# Patient Record
Sex: Female | Born: 1990 | Hispanic: No | Marital: Single | Smoking: Never smoker
Health system: Southern US, Community
[De-identification: ages and names within clinical notes are randomized; demographics above are authoritative.]

## PROBLEM LIST (undated history)

## (undated) ENCOUNTER — Emergency Department (HOSPITAL_COMMUNITY): Discharge status: Absent without leave | Payer: Medicare Other

## (undated) DIAGNOSIS — Z789 Other specified health status: Secondary | ICD-10-CM

## (undated) DIAGNOSIS — R4701 Aphasia: Secondary | ICD-10-CM

## (undated) DIAGNOSIS — N920 Excessive and frequent menstruation with regular cycle: Secondary | ICD-10-CM

## (undated) DIAGNOSIS — L853 Xerosis cutis: Secondary | ICD-10-CM

## (undated) DIAGNOSIS — F849 Pervasive developmental disorder, unspecified: Secondary | ICD-10-CM

## (undated) DIAGNOSIS — T8389XA Other specified complication of genitourinary prosthetic devices, implants and grafts, initial encounter: Secondary | ICD-10-CM

## (undated) DIAGNOSIS — R32 Unspecified urinary incontinence: Secondary | ICD-10-CM

## (undated) DIAGNOSIS — F71 Moderate intellectual disabilities: Secondary | ICD-10-CM

## (undated) DIAGNOSIS — R251 Tremor, unspecified: Secondary | ICD-10-CM

## (undated) DIAGNOSIS — J45909 Unspecified asthma, uncomplicated: Secondary | ICD-10-CM

## (undated) DIAGNOSIS — G8194 Hemiplegia, unspecified affecting left nondominant side: Secondary | ICD-10-CM

## (undated) DIAGNOSIS — G808 Other cerebral palsy: Secondary | ICD-10-CM

## (undated) DIAGNOSIS — Z0282 Encounter for adoption services: Secondary | ICD-10-CM

## (undated) DIAGNOSIS — L539 Erythematous condition, unspecified: Secondary | ICD-10-CM

## (undated) DIAGNOSIS — Z87898 Personal history of other specified conditions: Secondary | ICD-10-CM

## (undated) DIAGNOSIS — F88 Other disorders of psychological development: Secondary | ICD-10-CM

## (undated) DIAGNOSIS — Z8673 Personal history of transient ischemic attack (TIA), and cerebral infarction without residual deficits: Secondary | ICD-10-CM

---
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
## 2015-09-26 ENCOUNTER — Encounter (HOSPITAL_COMMUNITY): Payer: Self-pay | Admitting: *Deleted

## 2015-09-26 DIAGNOSIS — R938 Abnormal findings on diagnostic imaging of other specified body structures: Secondary | ICD-10-CM | POA: Insufficient documentation

## 2015-09-26 DIAGNOSIS — R1110 Vomiting, unspecified: Secondary | ICD-10-CM | POA: Insufficient documentation

## 2015-09-26 DIAGNOSIS — J45909 Unspecified asthma, uncomplicated: Secondary | ICD-10-CM | POA: Insufficient documentation

## 2015-09-26 LAB — COMPREHENSIVE METABOLIC PANEL
ALBUMIN: 4.4 g/dL (ref 3.5–5.0)
ALK PHOS: 63 U/L (ref 38–126)
ALT: 14 U/L (ref 14–54)
AST: 25 U/L (ref 15–41)
Anion gap: 6 (ref 5–15)
BILIRUBIN TOTAL: 0.5 mg/dL (ref 0.3–1.2)
BUN: 13 mg/dL (ref 6–20)
CALCIUM: 10.2 mg/dL (ref 8.9–10.3)
CO2: 24 mmol/L (ref 22–32)
Chloride: 106 mmol/L (ref 101–111)
Creatinine, Ser: 0.93 mg/dL (ref 0.44–1.00)
GFR calc Af Amer: >60 mL/min (ref >60)
GFR calc non Af Amer: >60 mL/min (ref >60)
GLUCOSE: 142 mg/dL — AB (ref 65–99)
Potassium: 3.6 mmol/L (ref 3.5–5.1)
Sodium: 136 mmol/L (ref 135–145)
TOTAL PROTEIN: 7.1 g/dL (ref 6.5–8.1)

## 2015-09-26 LAB — CBC
HCT: 38.3 % (ref 36.0–46.0)
Hemoglobin: 12.9 g/dL (ref 12.0–15.0)
MCH: 28.1 pg (ref 26.0–34.0)
MCHC: 33.7 g/dL (ref 30.0–36.0)
MCV: 83.4 fL (ref 78.0–100.0)
Platelets: 300 10*3/uL (ref 150–400)
RBC: 4.59 MIL/uL (ref 3.87–5.11)
RDW: 12.8 % (ref 11.5–15.5)
WBC: 13.6 10*3/uL — ABNORMAL HIGH (ref 4.0–10.5)

## 2015-09-26 NOTE — ED Triage Notes (Addendum)
Pt brought in by health care worker. She states pt started vomiting while eating dinner tonight around 8 pm. Health care worker states pt looks weak to her. Health care worker has not worked with this patient in over a month. Pt has cerebral palsy. Pt is in no apparent distress

## 2015-09-27 ENCOUNTER — Emergency Department (HOSPITAL_COMMUNITY): Payer: Medicare Other

## 2015-09-27 ENCOUNTER — Emergency Department (HOSPITAL_COMMUNITY)
Admission: EM | Admit: 2015-09-27 | Discharge: 2015-09-27 | Disposition: A | Discharge status: Home / Self Care | Payer: Medicare Other | Attending: Emergency Medicine | Admitting: Emergency Medicine

## 2015-09-27 DIAGNOSIS — R1110 Vomiting, unspecified: Secondary | ICD-10-CM | POA: Diagnosis not present

## 2015-09-27 DIAGNOSIS — R1111 Vomiting without nausea: Secondary | ICD-10-CM

## 2015-09-27 LAB — URINE MICROSCOPIC-ADD ON

## 2015-09-27 LAB — URINALYSIS, ROUTINE W REFLEX MICROSCOPIC
Glucose, UA: 100 mg/dL — AB
HGB URINE DIPSTICK: NEGATIVE
Ketones, ur: 15 mg/dL — AB
NITRITE: NEGATIVE
PROTEIN: 30 mg/dL — AB
SPECIFIC GRAVITY, URINE: 1.034 — AB (ref 1.005–1.030)
pH: 5.5 (ref 5.0–8.0)

## 2015-09-27 LAB — LIPASE, BLOOD: Lipase: 31 U/L (ref 11–51)

## 2015-09-27 LAB — POC URINE PREG, ED: PREG TEST UR: NEGATIVE

## 2015-09-27 MED ORDER — SODIUM CHLORIDE 0.9 % IV BOLUS (SEPSIS)
1000.0000 mL | Freq: Once | INTRAVENOUS | Status: AC
  Administered 2015-09-27: 1000 mL via INTRAVENOUS

## 2015-09-27 MED ORDER — IOPAMIDOL (ISOVUE-300) INJECTION 61%
INTRAVENOUS | Status: AC
  Administered 2015-09-27: 100 mL
  Filled 2015-09-27: qty 100

## 2015-09-27 MED ORDER — DIATRIZOATE MEGLUMINE & SODIUM 66-10 % PO SOLN
ORAL | Status: AC
  Filled 2015-09-27: qty 30

## 2015-09-27 NOTE — ED Notes (Signed)
EDP at bedside  

## 2015-09-27 NOTE — ED Notes (Signed)
Pt given second bottle of contrast

## 2015-09-27 NOTE — ED Notes (Signed)
Pt drank 1 bottle of contrast, CT aware and will bring over second bottle in 30 min

## 2015-09-27 NOTE — ED Provider Notes (Signed)
[facility]-EMERGENCY DEPT Provider Note   CSN: [id number] Arrival date & time: 09/26/15  2134  By signing my name below, I, Phillis Haggis, attest that this documentation has been prepared under the direction and in the presence of Tomasita Crumble, MD. Electronically Signed: Phillis Haggis, ED Scribe. 09/27/15. 1:57 AM.  First Provider Contact:  None    History   Chief Complaint Chief Complaint  Patient presents with  . Emesis    The history is provided by a caregiver. No language interpreter was used.  HPI Comments (Level 5 Caveat, pt is nonverbal at baseline): Desiree Cook is a 25 y.o. Female with a hx of cerebral palsy, mental retardation and seizures who presents to the Emergency Department complaining of emesis onset 5 hours ago. Caregiver states that pt began vomiting after one bite of dinner and has not eaten since then. Caregiver also notes that pt looked generally weak. Pt was able to drink apple juice in the waiting room. Parents and caregiver state that pt did not have any preceding symptoms earlier today. They deny fever, chills, sick contacts or diarrhea.   Past Medical History:  Diagnosis Date  . Asthma   . Menorrhagia 02/12/2011  . Mental disorder    CP/mental retardation  . Seizures (HCC)    hx - none since 6 yrs ago - no meds    Patient Active Problem List   Diagnosis Date Noted  . Menorrhagia 02/12/2011    Past Surgical History:  Procedure Laterality Date  . INTRAUTERINE DEVICE INSERTION  02/2006   And Exam under anesthesia  . IUD REMOVAL  02/12/2011   Procedure: INTRAUTERINE DEVICE (IUD) REMOVAL;  Surgeon: Sherron Monday, MD;  Location: [facility];  Service: Gynecology;  Laterality: N/A;  removal of intrauterine device, insertion of intrauterine device     OB History    No data available       Home Medications    Prior to Admission medications   Medication Sig Start Date End Date Taking? Authorizing Provider  albuterol (PROVENTIL) (5 MG/ML) 0.5% nebulizer  solution Take 2.5 mg by nebulization every 6 (six) hours as needed. For asthma attacks     Yes Historical Provider, MD    Family History History reviewed. No pertinent family history.  Social History Social History  Substance Use Topics  . Smoking status: Never Smoker  . Smokeless tobacco: Never Used  . Alcohol use No     Allergies   Review of patient's allergies indicates no known allergies.   Review of Systems Review of Systems  Unable to perform ROS: Patient nonverbal     Physical Exam Updated Vital Signs BP 120/80   Pulse 97   Temp 98.6 F (37 C) (Oral)   Resp 16   SpO2 100%   Physical Exam  Constitutional: She appears well-developed and well-nourished. No distress.  HENT:  Head: Normocephalic and atraumatic.  Nose: Nose normal.  Mouth/Throat: Oropharynx is clear and moist. No oropharyngeal exudate.  Eyes: Conjunctivae and EOM are normal. Pupils are equal, round, and reactive to light. No scleral icterus.  Neck: Normal range of motion. Neck supple. No JVD present. No tracheal deviation present. No thyromegaly present.  Cardiovascular: Normal rate, regular rhythm and normal heart sounds.  Exam reveals no gallop and no friction rub.   No murmur heard. Pulmonary/Chest: Effort normal and breath sounds normal. No respiratory distress. She has no wheezes. She exhibits no tenderness.  Abdominal: Soft. Bowel sounds are normal. She exhibits no distension and no mass.  There is no tenderness. There is no rebound and no guarding.  Musculoskeletal: Normal range of motion. She exhibits no edema or tenderness.  Moves all extremities  Lymphadenopathy:    She has no cervical adenopathy.  Neurological: She is alert.  Skin: Skin is warm and dry. No rash noted. No erythema. No pallor.  Nursing note and vitals reviewed.    ED Treatments / Results  DIAGNOSTIC STUDIES: Oxygen Saturation is 99% on RA, normal by my interpretation.    COORDINATION OF CARE: 1:47 AM-CT scan and  labs  Labs (all labs ordered are listed, but only abnormal results are displayed) Labs Reviewed  COMPREHENSIVE METABOLIC PANEL - Abnormal; Notable for the following:       Result Value   Glucose, Bld 142 (*)    All other components within normal limits  CBC - Abnormal; Notable for the following:    WBC 13.6 (*)    All other components within normal limits  URINALYSIS, ROUTINE W REFLEX MICROSCOPIC (NOT AT [facility]) - Abnormal; Notable for the following:    Color, Urine AMBER (*)    APPearance CLOUDY (*)    Specific Gravity, Urine 1.034 (*)    Glucose, UA 100 (*)    Bilirubin Urine SMALL (*)    Ketones, ur 15 (*)    Protein, ur 30 (*)    Leukocytes, UA SMALL (*)    All other components within normal limits  URINE MICROSCOPIC-ADD ON - Abnormal; Notable for the following:    Squamous Epithelial / LPF 6-30 (*)    Bacteria, UA FEW (*)    Casts HYALINE CASTS (*)    Crystals CA OXALATE CRYSTALS (*)    All other components within normal limits  LIPASE, BLOOD  POC URINE PREG, ED    EKG  EKG Interpretation None       Radiology Ct Abdomen Pelvis W Contrast  Result Date: 09/27/2015 CLINICAL DATA:  Initial evaluation for acute vomiting, weakness. History of cerebral palsy. EXAM: CT ABDOMEN AND PELVIS WITH CONTRAST TECHNIQUE: Multidetector CT imaging of the abdomen and pelvis was performed using the standard protocol following bolus administration of intravenous contrast. CONTRAST:  100mL ISOVUE-300 IOPAMIDOL (ISOVUE-300) INJECTION 61% COMPARISON:  None. FINDINGS: Visualized lung bases are clear. Liver demonstrates a normal contrast enhanced appearance. Gallbladder within normal limits. No biliary dilatation. Spleen, adrenal glands, and pancreas demonstrate no acute abnormality. Kidneys equal in size with symmetric enhancement. No nephrolithiasis, hydronephrosis, or focal enhancing renal mass. Stomach within normal limits. No evidence for bowel obstruction. Appendix visualized in the right  lower quadrant and is of normal caliber and appearance without associated inflammatory changes to suggest acute appendicitis. Moderate to large amount of retained stool throughout the colon. No abnormal wall thickening, mucosal enhancement, or inflammatory fat stranding seen about the bowels. Bladder within normal limits. IUD in appropriate position within the uterus. Ovaries within normal limits. Degenerating corpus luteal cyst noted within the left ovary. No free air or fluid. No adenopathy. Normal intravascular enhancement seen throughout the intra-abdominal aorta and its branch vessels. Minimal hazy soft tissue stranding seen just the left of midline posterior to the sacrum, indeterminate, but could potentially result a small area focal cellulitis (series 201, image 65). No abscess or drainable fluid collection. External soft tissues demonstrate no other acute abnormality. No acute osseous abnormality. No worrisome lytic or blastic osseous lesions. IMPRESSION: 1. Focal hazy soft tissue stranding within the subcutaneous fat just to the left of midline posterior to the sacrum. Finding is indeterminate,  but may reflect a small area of focal cellulitis or early ulceration. No abscess or drainable fluid collection. Correlation with physical exam recommended. 2. No other acute intra-abdominal or pelvic process identified. 3. Large amount of retained stool within the colon, suggesting constipation. 4. IUD in appropriate position within the uterus. Electronically Signed   By: Rise Mu M.D.   On: 09/27/2015 05:20    Procedures Procedures (including critical care time)  Medications Ordered in ED Medications  diatrizoate meglumine-sodium (GASTROGRAFIN) 66-10 % solution (not administered)  sodium chloride 0.9 % bolus 1,000 mL (0 mLs Intravenous Stopped 09/27/15 0359)  iopamidol (ISOVUE-300) 61 % injection (100 mLs  Contrast Given 09/27/15 0439)     Initial Impression / Assessment and Plan / ED Course   I have reviewed the triage vital signs and the nursing notes.  Pertinent labs & imaging results that were available during my care of the patient were reviewed by me and considered in my medical decision making (see chart for details).  Clinical Course    Patient presents to the ED for vomiting.  I can not obtain a good history due to CP. This may be appendicitis, cholecystitis, or other abdominal emergency.  WBC is 13, which may be due to the vomiting but will obtain CT scan for further evaluation.    5:37 AM CT neg for intrabdominal pathology.  Family educated on constipation.  No wounds on patient back as reported on CT.  She appears well and in NAD. VS remain within her normal limits and she is safe for DC.  Final Clinical Impressions(s) / ED Diagnoses   Final diagnoses:  Non-intractable vomiting without nausea, vomiting of unspecified type    I personally performed the services described in this documentation, which was scribed in my presence. The recorded information has been reviewed and is accurate.     New Prescriptions New Prescriptions   No medications on file     Tomasita Crumble, MD 09/27/15 [phone]

## 2016-06-15 DIAGNOSIS — N920 Excessive and frequent menstruation with regular cycle: Secondary | ICD-10-CM

## 2016-06-15 HISTORY — DX: Excessive and frequent menstruation with regular cycle: N92.0

## 2016-07-01 ENCOUNTER — Encounter (HOSPITAL_BASED_OUTPATIENT_CLINIC_OR_DEPARTMENT_OTHER): Payer: Self-pay | Admitting: Obstetrics and Gynecology

## 2016-07-01 DIAGNOSIS — N946 Dysmenorrhea, unspecified: Secondary | ICD-10-CM | POA: Diagnosis present

## 2016-07-06 ENCOUNTER — Encounter (HOSPITAL_BASED_OUTPATIENT_CLINIC_OR_DEPARTMENT_OTHER): Payer: Self-pay | Admitting: *Deleted

## 2016-07-06 NOTE — Progress Notes (Addendum)
SPOKE W/ PT'S MOTHER, PHYLLIS.  PT HAS CEREBRAL PALSY AND NONVERBAL BUT UNDERSTANDS AND FOLLOWS COMMANDS.   NPO AFTER MN.  ARRIVE AT 0600.  QUESTION CBC AND URINE PREG. BECAUSE PER MOTHER WILL BE ABLE TO GET URINE SAMPLE UNLESS IN AND OUT CATH AND POSSIBLY DR BOVARD ONLY WOULD NEED HG.  CALLED AND LM FOR JAMIE, OR SCHEDULR FOR DR Ellyn HackBOVARD ABOUT THIS AND THAT MOTHER WAS TOLD PROCEDURE WOULD BE July 31st .  WILL AWAIT FURTHER INSTRUCTIONS. PT WEARS PULL-UPS.  MENTAL MENTALITY OF 222-24 YEAR OLD.  ADDENDUM:  RECEIVED CALL FROM OFFICE, DR BOVARD STATED OK TO DO HG INSTEAD OF CBC AND URINE PREG. NOT NEEDED , DISCONTINUE ORDER.  ADDENDUM:  TODAY 07-09-2016 ,  REVIEWED CHART W/ DR CARIGNAN MDA VIA PHONE ,  OK TO PROCEED.

## 2016-07-14 ENCOUNTER — Encounter (HOSPITAL_BASED_OUTPATIENT_CLINIC_OR_DEPARTMENT_OTHER): Payer: Self-pay | Admitting: Anesthesiology

## 2016-07-14 NOTE — H&P (Signed)
Desiree Cook is an 26 y.o. female G0 for EUA, remove and replace IUD.  Also pap smear.  Pt is severely mentally handicapped.  IUD to control bleeding.  Pertinent Gynecological History: G0P0  No pap No STD IUD for cycle control  Menstrual History:  No LMP recorded. Patient is not currently having periods (Reason: IUD).    Past Medical History:  Diagnosis Date  . Adopted   . 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 Developmental disorder, pervasive, active state   . Dry skin   . Global developmental delay    acts as 222-24 year old  . Hemiparesis, left (HCC)    residual from x2 stroke's at birth  . History of seizures    epilepsy--  per mother no seizure since approx. 2006 and no medications  . History of stroke    x2 stroke at birth w/ left hemiparesis residual --- mother was drug addict , suspected pt was addicted  . Menorrhagia due to intrauterine device (IUD) (HCC) 06/2016  . Mild asthma   . 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 Nonverbal per adopted mother-- she understands and follow commands   at baseline due to cerebral palsy w/ development delays (developmental age of 42-24 year old)  . Redness of skin    sacral area  . Tremor of both hands    unknown cause-- clinches hands  . Urinary incontinence    wears pull-ups    Past Surgical History:  Procedure Laterality Date  . INTRAUTERINE DEVICE INSERTION  02/2006   And Exam under anesthesia  . IUD REMOVAL  02/12/2011   Procedure: INTRAUTERINE DEVICE (IUD) REMOVAL;  Surgeon: Sherron MondayJody Bovard, MD;  Location: [facility];  Service: Gynecology;  Laterality: N/A;  removal of intrauterine device, insertion of intrauterine device     FH: parents - alcoholism and addiction  Social History:  reports that she has never smoked. She has never used smokeless tobacco.  She reports that she does not drink alcohol or use drugs. single, active at [employer]  Allergies: No Known Allergies  Meds: Mirena, MVI, Mirala    Review of Systems  Unable to perform ROS: Other  Constitutional: Negative.   Skin: Negative.   Pt mentally handicapped  Height 5' (1.524 m), weight 38.6 kg (85 lb). Physical Exam  Constitutional: She is oriented to person, place, and time. She appears well-developed and well-nourished.  HENT:  Head: Normocephalic and atraumatic.  Cardiovascular: Normal rate and regular rhythm.   Respiratory: Breath sounds normal. No respiratory distress. She has no wheezes.  GI: Soft. Bowel sounds are normal. She exhibits no distension. There is no tenderness.  Musculoskeletal: Normal range of motion.  Neurological: She is alert and oriented to person, place, and time.  Skin: Skin is warm and dry.  Psychiatric: She has a normal mood and affect. Her behavior is normal.    Assessment/Plan: 25yo G0P0 with severe mental handicap for EUA, IUD remove and reinsertion D/w guardians r/b/a of IUD replacement, IUD originally placed in 2008, replaced 2012, another replacement Also pap, exam  Desiree Cook 07/14/2016, 8:45 PM

## 2016-07-14 NOTE — Anesthesia Preprocedure Evaluation (Addendum)
Anesthesia Evaluation  Patient identified by MRN, date of birth, ID band Patient awake    Reviewed: Allergy & Precautions, NPO status , Patient's Chart, lab work & pertinent test results  Airway Mallampati: II       Dental  (+) Teeth Intact, Dental Advisory Given   Pulmonary asthma ,    breath sounds clear to auscultation       Cardiovascular negative cardio ROS   Rhythm:Regular Rate:Normal     Neuro/Psych Seizures -, Well Controlled,  PSYCHIATRIC DISORDERS CVA, Residual Symptoms    GI/Hepatic negative GI ROS, Neg liver ROS,   Endo/Other  negative endocrine ROS  Renal/GU negative Renal ROS     Musculoskeletal negative musculoskeletal ROS (+)   Abdominal   Peds  (+) mental retardationCerebral Palsy - Non-verbal per chart   Hematology negative hematology ROS (+)   Anesthesia Other Findings Day of surgery medications reviewed with the patient.  Reproductive/Obstetrics negative OB ROS                            Anesthesia Physical Anesthesia Plan  ASA: III  Anesthesia Plan: MAC   Post-op Pain Management:    Induction: Intravenous  Airway Management Planned: Natural Airway  Additional Equipment:   Intra-op Plan:   Post-operative Plan:   Informed Consent: I have reviewed the patients History and Physical, chart, labs and discussed the procedure including the risks, benefits and alternatives for the proposed anesthesia with the patient or authorized representative who has indicated his/her understanding and acceptance.     Plan Discussed with:   Anesthesia Plan Comments:        Anesthesia Quick Evaluation

## 2016-07-15 ENCOUNTER — Ambulatory Visit (HOSPITAL_BASED_OUTPATIENT_CLINIC_OR_DEPARTMENT_OTHER): Payer: Medicare Other | Admitting: Anesthesiology

## 2016-07-15 ENCOUNTER — Ambulatory Visit (HOSPITAL_BASED_OUTPATIENT_CLINIC_OR_DEPARTMENT_OTHER)
Admission: RE | Admit: 2016-07-15 | Discharge: 2016-07-15 | Disposition: A | Discharge status: Home / Self Care | Payer: Medicare Other | Source: Ambulatory Visit | Attending: Obstetrics and Gynecology | Admitting: Obstetrics and Gynecology

## 2016-07-15 ENCOUNTER — Encounter (HOSPITAL_BASED_OUTPATIENT_CLINIC_OR_DEPARTMENT_OTHER): Payer: Self-pay

## 2016-07-15 ENCOUNTER — Encounter (HOSPITAL_BASED_OUTPATIENT_CLINIC_OR_DEPARTMENT_OTHER)
Admission: RE | Disposition: A | Discharge status: Home / Self Care | Payer: Self-pay | Source: Ambulatory Visit | Attending: Obstetrics and Gynecology

## 2016-07-15 DIAGNOSIS — G40909 Epilepsy, unspecified, not intractable, without status epilepticus: Secondary | ICD-10-CM | POA: Insufficient documentation

## 2016-07-15 DIAGNOSIS — Z79899 Other long term (current) drug therapy: Secondary | ICD-10-CM | POA: Insufficient documentation

## 2016-07-15 DIAGNOSIS — N946 Dysmenorrhea, unspecified: Secondary | ICD-10-CM | POA: Diagnosis present

## 2016-07-15 DIAGNOSIS — Z811 Family history of alcohol abuse and dependence: Secondary | ICD-10-CM | POA: Insufficient documentation

## 2016-07-15 DIAGNOSIS — Z30433 Encounter for removal and reinsertion of intrauterine contraceptive device: Secondary | ICD-10-CM | POA: Diagnosis present

## 2016-07-15 DIAGNOSIS — F72 Severe intellectual disabilities: Secondary | ICD-10-CM | POA: Diagnosis not present

## 2016-07-15 DIAGNOSIS — Z8673 Personal history of transient ischemic attack (TIA), and cerebral infarction without residual deficits: Secondary | ICD-10-CM | POA: Insufficient documentation

## 2016-07-15 DIAGNOSIS — G808 Other cerebral palsy: Secondary | ICD-10-CM | POA: Diagnosis not present

## 2016-07-15 HISTORY — DX: Pervasive developmental disorder, unspecified: F84.9

## 2016-07-15 HISTORY — DX: Aphasia: R47.01

## 2016-07-15 HISTORY — DX: Erythematous condition, unspecified: L53.9

## 2016-07-15 HISTORY — DX: Encounter for adoption services: Z02.82

## 2016-07-15 HISTORY — DX: Other cerebral palsy: G80.8

## 2016-07-15 HISTORY — DX: Personal history of other specified conditions: Z87.898

## 2016-07-15 HISTORY — DX: Tremor, unspecified: R25.1

## 2016-07-15 HISTORY — DX: Unspecified asthma, uncomplicated: J45.909

## 2016-07-15 HISTORY — DX: Personal history of transient ischemic attack (TIA), and cerebral infarction without residual deficits: Z86.73

## 2016-07-15 HISTORY — DX: Unspecified urinary incontinence: R32

## 2016-07-15 HISTORY — PX: IUD REMOVAL: SHX5392

## 2016-07-15 HISTORY — DX: Xerosis cutis: L85.3

## 2016-07-15 HISTORY — DX: Other specified complication of genitourinary prosthetic devices, implants and grafts, initial encounter: T83.89XA

## 2016-07-15 HISTORY — DX: Hemiplegia, unspecified affecting left nondominant side: G81.94

## 2016-07-15 HISTORY — DX: Moderate intellectual disabilities: F71

## 2016-07-15 HISTORY — PX: INTRAUTERINE DEVICE (IUD) INSERTION: SHX5877

## 2016-07-15 HISTORY — DX: Excessive and frequent menstruation with regular cycle: N92.0

## 2016-07-15 HISTORY — DX: Other disorders of psychological development: F88

## 2016-07-15 HISTORY — DX: Other specified health status: Z78.9

## 2016-07-15 LAB — POCT HEMOGLOBIN-HEMACUE: HEMOGLOBIN: 12.4 g/dL (ref 12.0–15.0)

## 2016-07-15 SURGERY — REMOVAL, INTRAUTERINE DEVICE
Anesthesia: General | Site: Vagina

## 2016-07-15 MED ORDER — PROPOFOL 10 MG/ML IV BOLUS
INTRAVENOUS | Status: DC | PRN
  Administered 2016-07-15 (×2): 40 mg via INTRAVENOUS

## 2016-07-15 MED ORDER — PROMETHAZINE HCL 25 MG/ML IJ SOLN
6.2500 mg | INTRAMUSCULAR | Status: DC | PRN
  Filled 2016-07-15: qty 1

## 2016-07-15 MED ORDER — PROPOFOL 10 MG/ML IV BOLUS
INTRAVENOUS | Status: AC
  Filled 2016-07-15: qty 20

## 2016-07-15 MED ORDER — MIDAZOLAM HCL 2 MG/2ML IJ SOLN
INTRAMUSCULAR | Status: AC
  Filled 2016-07-15: qty 2

## 2016-07-15 MED ORDER — KETOROLAC TROMETHAMINE 30 MG/ML IJ SOLN
INTRAMUSCULAR | Status: DC | PRN
  Administered 2016-07-15: 30 mg via INTRAVENOUS

## 2016-07-15 MED ORDER — FENTANYL CITRATE (PF) 100 MCG/2ML IJ SOLN
INTRAMUSCULAR | Status: AC
  Filled 2016-07-15: qty 2

## 2016-07-15 MED ORDER — LACTATED RINGERS IV SOLN
INTRAVENOUS | Status: DC
  Administered 2016-07-15: 07:00:00 via INTRAVENOUS
  Filled 2016-07-15: qty 1000

## 2016-07-15 MED ORDER — MIDAZOLAM HCL 5 MG/5ML IJ SOLN
INTRAMUSCULAR | Status: DC | PRN
  Administered 2016-07-15: 1 mg via INTRAVENOUS

## 2016-07-15 MED ORDER — FENTANYL CITRATE (PF) 250 MCG/5ML IJ SOLN
INTRAMUSCULAR | Status: DC | PRN
  Administered 2016-07-15: 25 ug via INTRAVENOUS

## 2016-07-15 MED ORDER — LIDOCAINE 2% (20 MG/ML) 5 ML SYRINGE
INTRAMUSCULAR | Status: AC
  Filled 2016-07-15: qty 5

## 2016-07-15 MED ORDER — LACTATED RINGERS IV SOLN
INTRAVENOUS | Status: DC
  Filled 2016-07-15: qty 1000

## 2016-07-15 MED ORDER — KETOROLAC TROMETHAMINE 30 MG/ML IJ SOLN
INTRAMUSCULAR | Status: AC
  Filled 2016-07-15: qty 1

## 2016-07-15 MED ORDER — FENTANYL CITRATE (PF) 100 MCG/2ML IJ SOLN
25.0000 ug | INTRAMUSCULAR | Status: DC | PRN
Start: 2016-07-15 — End: 2016-07-15
  Filled 2016-07-15: qty 1

## 2016-07-15 SURGICAL SUPPLY — 12 items
CATH ROBINSON RED A/P 16FR (CATHETERS) ×3 IMPLANT
CLOTH BEACON ORANGE TIMEOUT ST (SAFETY) ×3 IMPLANT
CONTAINER PREFILL 10% NBF 60ML (FORM) IMPLANT
GLOVE BIO SURGEON STRL SZ 6.5 (GLOVE) ×2 IMPLANT
GLOVE BIO SURGEONS STRL SZ 6.5 (GLOVE) ×1
GLOVE BIOGEL PI IND STRL 7.0 (GLOVE) ×2 IMPLANT
GLOVE BIOGEL PI INDICATOR 7.0 (GLOVE) ×4
GOWN STRL REUS W/TWL LRG LVL3 (GOWN DISPOSABLE) ×6 IMPLANT
NEEDLE SPNL 22GX3.5 QUINCKE BK (NEEDLE) ×3 IMPLANT
PACK VAGINAL MINOR WOMEN LF (CUSTOM PROCEDURE TRAY) ×3 IMPLANT
PAD PREP 24X48 CUFFED NSTRL (MISCELLANEOUS) ×3 IMPLANT
TOWEL OR 17X24 6PK STRL BLUE (TOWEL DISPOSABLE) ×6 IMPLANT

## 2016-07-15 NOTE — Discharge Instructions (Signed)

## 2016-07-15 NOTE — Brief Op Note (Signed)
07/15/2016  7:50 AM  PATIENT:  Desiree Cook  26 y.o. female  PRE-OPERATIVE DIAGNOSIS:  birth control, IUD replacement, developmentally disabled, for EUA  POST-OPERATIVE DIAGNOSIS:  Same  PROCEDURE:  Procedure(s): INTRAUTERINE DEVICE (IUD) REMOVAL (N/A) INTRAUTERINE DEVICE (IUD) INSERTION (N/A) pap smear, gonorrhea and chlamydia cultures  SURGEON:  Surgeon(s) and Role:    * Bovard-Stuckert, Sayer Masini, MD - Primary  ANESTHESIA:   MAC  EBL:  Total I/O In: 400 [I.V.:400] Out: 2 [Blood:2]  BLOOD ADMINISTERED:none  DRAINS: none   LOCAL MEDICATIONS USED:  NONE  SPECIMEN:  Source of Specimen:  pap, GC/Chl  DISPOSITION OF SPECIMEN:  PATHOLOGY thru office  COUNTS:  YES  TOURNIQUET:  * No tourniquets in log *  DICTATION: .Other Dictation: Dictation Number [id number]  PLAN OF CARE: Discharge to home after PACU  PATIENT DISPOSITION:  PACU - hemodynamically stable.   Delay start of Pharmacological VTE agent (>24hrs) due to surgical blood loss or risk of bleeding: not applicable

## 2016-07-15 NOTE — Op Note (Signed)
[name], Desiree Cook              ACCOUNT NO.:  [id number]  MEDICAL RECORD NO.:  [id number]  LOCATION:                                 FACILITY:  PHYSICIAN:  Sherron MondayJody Bovard, MD             DATE OF BIRTH:  DATE OF PROCEDURE:  07/15/2016 DATE OF DISCHARGE:                              OPERATIVE REPORT   PREOPERATIVE DIAGNOSIS:  Intrauterine device replacement, developmentally disabled, for exam under anesthesia, Pap and gonorrhea and chlamydia screen.  POSTOPERATIVE DIAGNOSIS:  Intrauterine device replacement, developmentally disabled, for exam under anesthesia, Pap and gonorrhea and chlamydia screen.  PROCEDURE:  IUD removal and reinsertion, as well as exam under anesthesia with Pap, gonorrhea, and chlamydia cultures.  SURGEON:  Sherron MondayJody Bovard, MD.  ASSISTANT:  None.  COMPLICATIONS:  None.  PATHOLOGY:  Pap, gonorrhea and chlamydia through my office.  IV FLUIDS:  400 mL.  URINE OUTPUT:  None.  ESTIMATED BLOOD LOSS:  2 mL.  PROCEDURE DESCRIPTION:  After informed consent was reviewed with the patient and her mother including risks, benefits, and alternatives of IUD removal and replacement, she was transported to the OR, placed on the table in supine position, given propofol for sedation, placed in the Yellofin stirrups.  A Peterson open-sided speculum was placed in her vagina.  Cervix was easily visualized after a bimanual exam had been performed revealing a small anteverted uterus and the speculum was placed in vagina.  Cervix was easily visualized.  IUD strings were seen. Pap smear was collected.  Gonorrhea and chlamydia read with Pap smear. The IUD strings were grasped with a long Kelly and removed in entirety and this was confirmed by OR staff as well as MD.  The uterus was sounded approximately 6 cm and the IUD was placed.  The strings were trimmed.  The speculum was removed from the vagina.  Counts were correct from the operating surgeon.  She was transferred to the  PACU in stable condition.     Sherron MondayJody Bovard, MD   ______________________________ Sherron MondayJody Bovard, MD    JB/MEDQ  D:  07/15/2016  T:  07/15/2016  Job:  [id number]

## 2016-07-15 NOTE — Interval H&P Note (Signed)
History and Physical Interval Note:  07/15/2016 7:06 AM  Desiree Cook  has presented today for surgery, with the diagnosis of birth control, IUD replacement, developmentally disabled  The various methods of treatment have been discussed with the patient and family. After consideration of risks, benefits and other options for treatment, the patient has consented to  Procedure(s): INTRAUTERINE DEVICE (IUD) REMOVAL (N/A) INTRAUTERINE DEVICE (IUD) INSERTION (N/A) as a surgical intervention .  The patient's history has been reviewed, patient examined, no change in status, stable for surgery.  I have reviewed the patient's chart and labs.  Questions were answered to the patient's satisfaction.  Also pap smear and GC/Chl.     Akshath Mccarey Bovard-Stuckert

## 2016-07-15 NOTE — Transfer of Care (Signed)
Immediate Anesthesia Transfer of Care Note  Patient: Desiree Cook  Procedure(s) Performed: Procedure(s): INTRAUTERINE DEVICE (IUD) REMOVAL (N/A) INTRAUTERINE DEVICE (IUD) INSERTION (N/A)  Patient Location: PACU  Anesthesia Type:MAC  Level of Consciousness: awake, alert  and oriented  Airway & Oxygen Therapy: Patient Spontanous Breathing and Patient connected to nasal cannula oxygen  Post-op Assessment: Report given to RN  Post vital signs: Reviewed and stable  Last Vitals: 114/82, 71, 11, 100%, 98.7 Vitals:   07/15/16 0617  BP: 115/74  Pulse: 61  Resp: 18  Temp: 36.9 C    Last Pain:  Vitals:   07/15/16 0617  TempSrc: Oral      Patients Stated Pain Goal:  (unable to determine due to CP) (86/38/17 7116)  Complications: No apparent anesthesia complications

## 2016-07-15 NOTE — Anesthesia Postprocedure Evaluation (Signed)
Anesthesia Post Note  Patient: Desiree Cook  Procedure(s) Performed: Procedure(s) (LRB): INTRAUTERINE DEVICE (IUD) REMOVAL (N/A) INTRAUTERINE DEVICE (IUD) INSERTION (N/A)  Patient location during evaluation: PACU Anesthesia Type: General Level of consciousness: awake and alert Pain management: pain level controlled Vital Signs Assessment: post-procedure vital signs reviewed and stable Respiratory status: spontaneous breathing, nonlabored ventilation, respiratory function stable and patient connected to nasal cannula oxygen Cardiovascular status: stable and blood pressure returned to baseline Anesthetic complications: no       Last Vitals:  Vitals:   07/15/16 0800 07/15/16 0806  BP: 96/80   Pulse: 73 81  Resp: 13 17  Temp:      Last Pain:  Vitals:   07/15/16 0617  TempSrc: Oral                 Shelton SilvasKevin D Hollis

## 2016-07-15 NOTE — Anesthesia Procedure Notes (Signed)
Procedure Name: MAC Date/Time: 07/15/2016 7:28 AM Performed by: Bethena Roys T Pre-anesthesia Checklist: Patient identified, Timeout performed, Emergency Drugs available, Suction available and Patient being monitored Patient Re-evaluated:Patient Re-evaluated prior to inductionOxygen Delivery Method: Nasal cannula

## 2016-07-16 ENCOUNTER — Encounter (HOSPITAL_BASED_OUTPATIENT_CLINIC_OR_DEPARTMENT_OTHER): Payer: Self-pay | Admitting: Obstetrics and Gynecology
